# Patient Record
Sex: Male | Born: 1965 | Race: White | Hispanic: No | Marital: Married | Smoking: Never smoker
Health system: Southern US, Community
[De-identification: ages and names within clinical notes are randomized; demographics above are authoritative.]

## PROBLEM LIST (undated history)

## (undated) DIAGNOSIS — N529 Male erectile dysfunction, unspecified: Secondary | ICD-10-CM

## (undated) DIAGNOSIS — I10 Essential (primary) hypertension: Secondary | ICD-10-CM

## (undated) DIAGNOSIS — K219 Gastro-esophageal reflux disease without esophagitis: Secondary | ICD-10-CM

---

## 2000-05-17 ENCOUNTER — Emergency Department (HOSPITAL_COMMUNITY): Admission: EM | Admit: 2000-05-17 | Discharge: 2000-05-18 | Payer: Self-pay | Admitting: *Deleted

## 2009-10-17 ENCOUNTER — Emergency Department (HOSPITAL_BASED_OUTPATIENT_CLINIC_OR_DEPARTMENT_OTHER): Admission: EM | Admit: 2009-10-17 | Discharge: 2009-10-18 | Payer: Self-pay | Admitting: Emergency Medicine

## 2009-10-22 ENCOUNTER — Ambulatory Visit: Payer: Self-pay | Admitting: Diagnostic Radiology

## 2018-06-25 ENCOUNTER — Emergency Department (HOSPITAL_BASED_OUTPATIENT_CLINIC_OR_DEPARTMENT_OTHER): Payer: BLUE CROSS/BLUE SHIELD

## 2018-06-25 ENCOUNTER — Encounter (HOSPITAL_BASED_OUTPATIENT_CLINIC_OR_DEPARTMENT_OTHER): Payer: Self-pay | Admitting: Emergency Medicine

## 2018-06-25 ENCOUNTER — Other Ambulatory Visit: Payer: Self-pay

## 2018-06-25 ENCOUNTER — Emergency Department (HOSPITAL_BASED_OUTPATIENT_CLINIC_OR_DEPARTMENT_OTHER)
Admission: EM | Admit: 2018-06-25 | Discharge: 2018-06-25 | Disposition: A | Discharge status: Home / Self Care | Payer: BLUE CROSS/BLUE SHIELD | Attending: Emergency Medicine | Admitting: Emergency Medicine

## 2018-06-25 DIAGNOSIS — S93402A Sprain of unspecified ligament of left ankle, initial encounter: Secondary | ICD-10-CM | POA: Diagnosis not present

## 2018-06-25 DIAGNOSIS — Y999 Unspecified external cause status: Secondary | ICD-10-CM | POA: Insufficient documentation

## 2018-06-25 DIAGNOSIS — Y939 Activity, unspecified: Secondary | ICD-10-CM | POA: Diagnosis not present

## 2018-06-25 DIAGNOSIS — Y929 Unspecified place or not applicable: Secondary | ICD-10-CM | POA: Insufficient documentation

## 2018-06-25 DIAGNOSIS — W010XXA Fall on same level from slipping, tripping and stumbling without subsequent striking against object, initial encounter: Secondary | ICD-10-CM | POA: Diagnosis not present

## 2018-06-25 DIAGNOSIS — I10 Essential (primary) hypertension: Secondary | ICD-10-CM | POA: Insufficient documentation

## 2018-06-25 DIAGNOSIS — Z79899 Other long term (current) drug therapy: Secondary | ICD-10-CM | POA: Insufficient documentation

## 2018-06-25 DIAGNOSIS — S99912A Unspecified injury of left ankle, initial encounter: Secondary | ICD-10-CM | POA: Diagnosis present

## 2018-06-25 HISTORY — DX: Male erectile dysfunction, unspecified: N52.9

## 2018-06-25 HISTORY — DX: Essential (primary) hypertension: I10

## 2018-06-25 HISTORY — DX: Gastro-esophageal reflux disease without esophagitis: K21.9

## 2018-06-25 MED ORDER — NAPROXEN 375 MG PO TABS: ORAL_TABLET | ORAL | 0 refills | Status: AC

## 2018-06-25 NOTE — ED Triage Notes (Signed)
Patient states he fell on wet floor last night around 2200; co left ankle pain; states painful to bear weight. No obvious deformity noted; swelling to left ankle noted.

## 2018-06-25 NOTE — ED Provider Notes (Signed)
[facility] Provider Note: Lowella DellJ. Lane Chace Bisch, MD, FACEP  CSN: [id number] MRN: [id number] ARRIVAL: 06/25/18 at 0610 ROOM: [facility]/OTF   CHIEF COMPLAINT  Ankle Injury   HISTORY OF PRESENT ILLNESS  06/25/18 6:34 AM Don Shaw is a 52 y.o. male who slipped on a wet floor and fell yesterday evening about 10 PM.  This was at a party and he admits to drinking alcohol.  He injured his left medial ankle.  Pain has worsened overnight and he now rates it as an 8 out of 10 with attempted weightbearing but negligible at rest.  There is some swelling over the medial malleolus.  He denies other injury.  He took 800 mg of ibuprofen prior to arrival with partial relief.   Past Medical History:  Diagnosis Date  . Erectile dysfunction   . GERD (gastroesophageal reflux disease)   . Hypertension     History reviewed. No pertinent surgical history.  No family history on file.  Social History   Tobacco Use  . Smoking status: Never Smoker  . Smokeless tobacco: Never Used  Substance Use Topics  . Alcohol use: Not on file  . Drug use: Not on file    Prior to Admission medications   Medication Sig Start Date End Date Taking? Authorizing Provider  losartan (COZAAR) 100 MG tablet Take by mouth. 02/11/17 11/19/18 Yes [provider]  omeprazole (PRILOSEC OTC) 20 MG tablet Take by mouth. 11/19/17  Yes [provider]  sildenafil (REVATIO) 20 MG tablet Take 1-5 tablets daily as needed 02/11/17  Yes [provider]  naproxen (NAPROSYN) 375 MG tablet Take 1 tablet twice daily as needed for pain. 06/25/18   Leandre Wien, MD    Allergies Amoxicillin   REVIEW OF SYSTEMS  Negative except as noted here or in the History of Present Illness.   PHYSICAL EXAMINATION  Initial Vital Signs Blood pressure (!) 138/99, pulse (!) 119, temperature 98.2 F (36.8 C), temperature source Oral, resp. rate 20, height 5\' 9"  (1.753 m), weight 97.5 kg, SpO2 100 %.  Examination General:  Well-developed, well-nourished male in no acute distress; appearance consistent with age of record HENT: normocephalic; atraumatic Eyes: pupils equal, round and reactive to light; extraocular muscles intact Neck: supple Heart: regular rate and rhythm Lungs: clear to auscultation bilaterally Abdomen: soft; nondistended; nontender;  bowel sounds present Extremities: No deformity; tenderness and swelling over left medial malleolus, right foot distally neurovascularly intact with intact tendon function; pulses normal Neurologic: Awake, alert and oriented; motor function intact in all extremities and symmetric; no facial droop Skin: Warm and dry Psychiatric: Normal mood and affect   RESULTS  Summary of this visit's results, reviewed by myself:   EKG Interpretation  Date/Time:    Ventricular Rate:    PR Interval:    QRS Duration:   QT Interval:    QTC Calculation:   R Axis:     Text Interpretation:        Laboratory Studies: No results found for this or any previous visit (from the past 24 hour(s)). Imaging Studies: Dg Ankle Complete Left  Result Date: 06/25/2018 CLINICAL DATA:  Larey SeatFell on wet floor. Pain. EXAM: LEFT ANKLE COMPLETE - 3+ VIEW COMPARISON:  None. FINDINGS: No malleolar fracture is seen, however there is widening of the ankle mortise medially. Soft tissue swelling is present, greatest medially. IMPRESSION: Widening of the ankle mortise medially with associated soft tissue swelling. No malleolar fracture is seen. Electronically Signed   By: Dale DurhamJohn T Curnes M.D.  On: 06/25/2018 07:24    ED COURSE and MDM  Nursing notes and initial vitals signs, including pulse oximetry, reviewed.  Vitals:   06/25/18 0617 06/25/18 0618  BP: (!) 138/99   Pulse: (!) 119   Resp: 20   Temp: 98.2 F (36.8 C)   TempSrc: Oral   SpO2: 100%   Weight:  97.5 kg  Height:  5\' 9"  (1.753 m)    PROCEDURES    ED DIAGNOSES     ICD-10-CM   1. Fall from slip, trip, or stumble, initial  encounter W01.0XXA   2. Sprain of left ankle, unspecified ligament, initial encounter O96.295M        Paula Libra, MD 06/25/18 2242

## 2018-06-25 NOTE — ED Provider Notes (Signed)
X-rays show no fx.  Given d/c instructions to f/u with ortho.  Advised pt of results, advised that given the widened ankle mortis, important to f/u with ortho.   Don Shaw, Don Lobos, MD 06/25/18 [phone]

## 2020-05-25 IMAGING — DX DG ANKLE COMPLETE 3+V*L*
3 series · 3 of 3 positions shown · non-contrast
Comparison: None.

CLINICAL DATA: Fell on wet floor. Pain.

EXAM:
LEFT ANKLE COMPLETE - 3+ VIEW

[ankle ap]
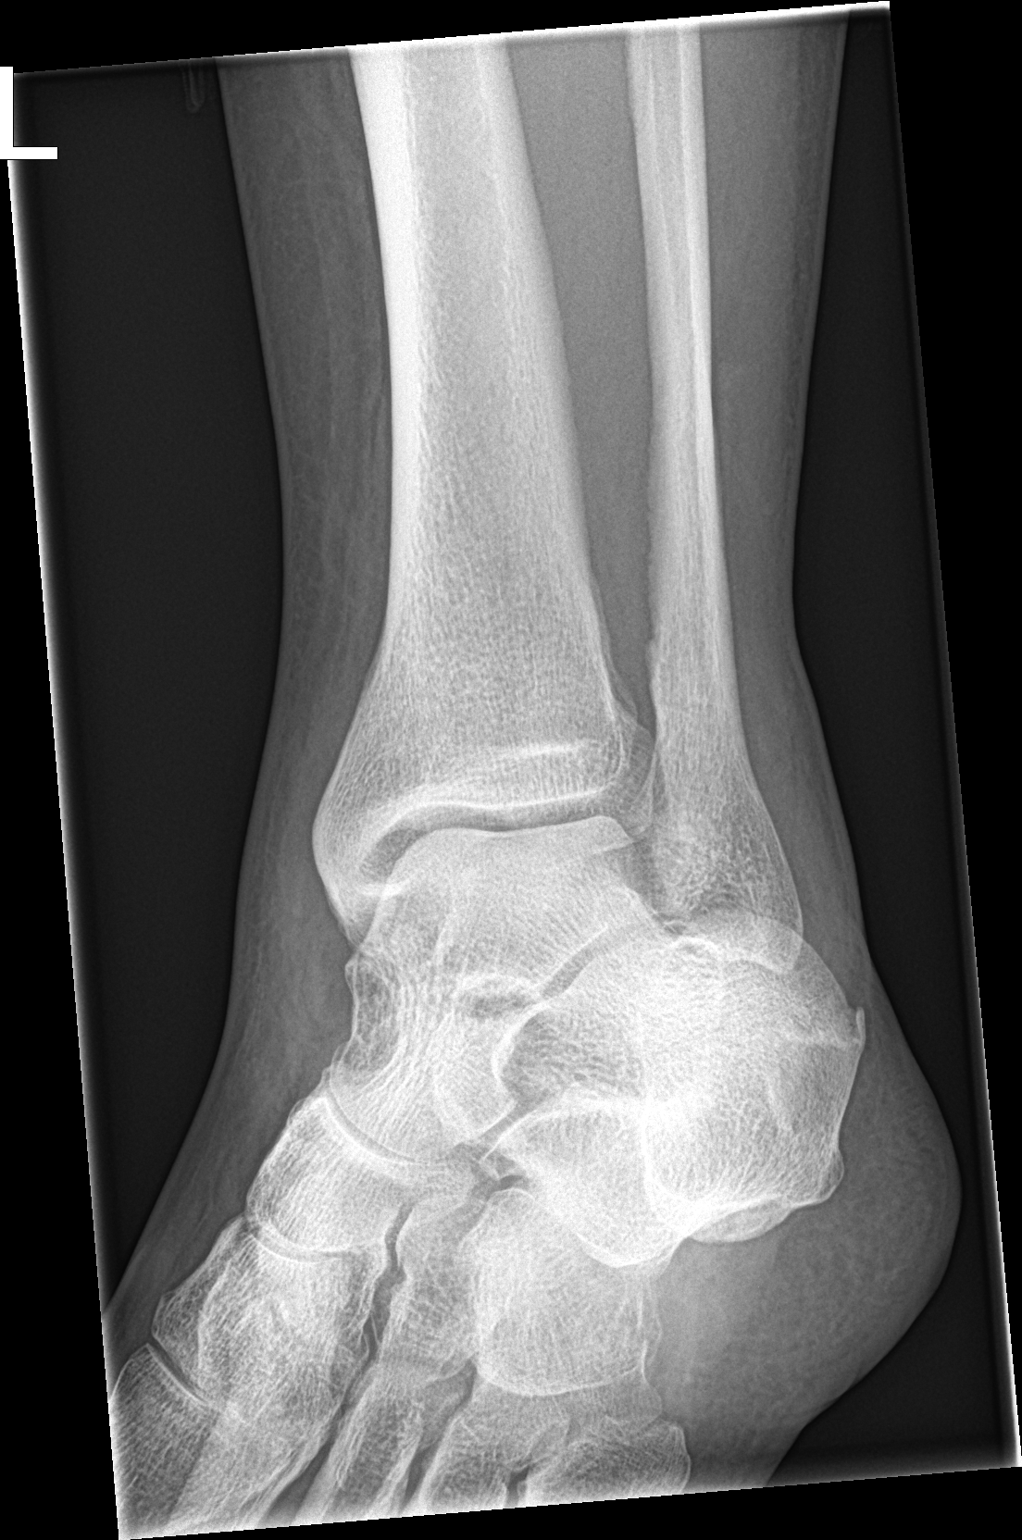

[ankle lat]
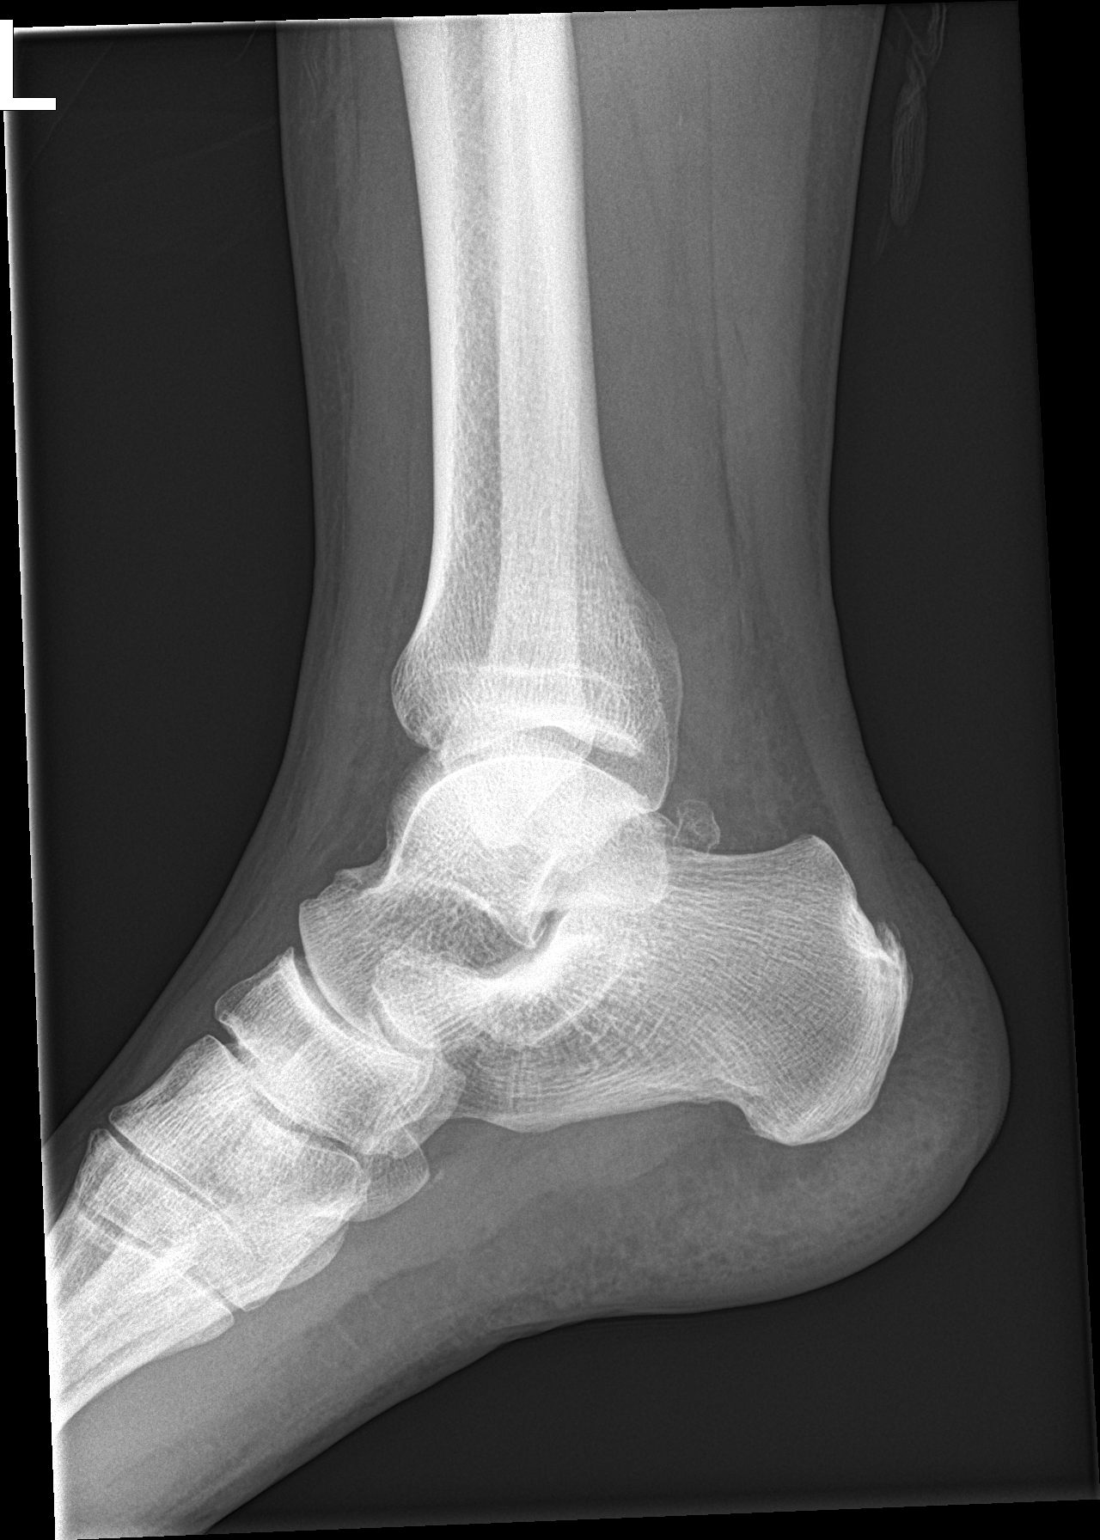

[ankle obl]
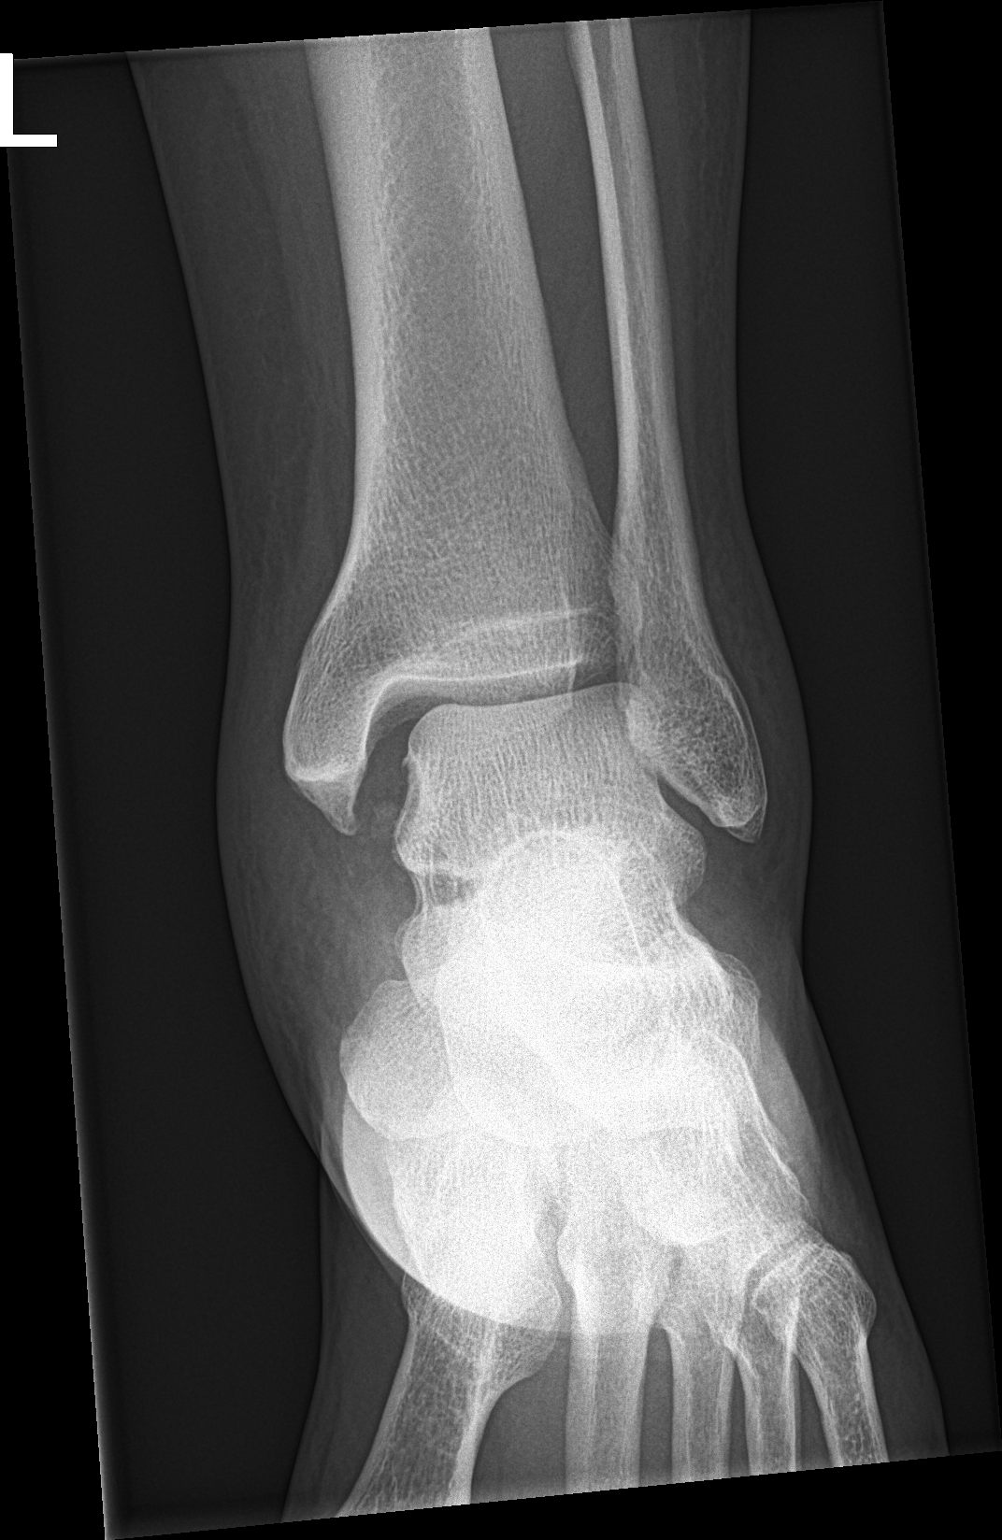

[3 of 3 positions shown; findings below may reference images not displayed]

FINDINGS: No malleolar fracture is seen, however there is widening of the
ankle mortise medially. Soft tissue swelling is present, greatest
medially.
IMPRESSION: Widening of the ankle mortise medially with associated soft tissue
swelling. No malleolar fracture is seen.
